# Patient Record
Sex: Male | Born: 2000 | Race: Black or African American | Hispanic: No | Marital: Single | Smoking: Never smoker
Health system: Southern US, Community
[De-identification: ages and names within clinical notes are randomized; demographics above are authoritative.]

---
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
## 2018-11-15 ENCOUNTER — Emergency Department
Admission: EM | Admit: 2018-11-15 | Discharge: 2018-11-15 | Disposition: A | Discharge status: Home / Self Care | Payer: BC Managed Care – PPO | Attending: Emergency Medicine | Admitting: Emergency Medicine

## 2018-11-15 ENCOUNTER — Other Ambulatory Visit: Payer: Self-pay

## 2018-11-15 DIAGNOSIS — J029 Acute pharyngitis, unspecified: Secondary | ICD-10-CM | POA: Diagnosis not present

## 2018-11-15 MED ORDER — AMOXICILLIN 875 MG PO TABS: 875.0000 mg | ORAL_TABLET | Freq: Two times a day (BID) | ORAL | 0 refills | Status: DC

## 2018-11-15 MED ORDER — MAGIC MOUTHWASH W/LIDOCAINE: 5.0000 mL | Freq: Four times a day (QID) | ORAL | 0 refills | Status: DC

## 2018-11-15 MED ORDER — AMOXICILLIN 500 MG PO CAPS
1000.0000 mg | ORAL_CAPSULE | Freq: Once | ORAL | Status: AC
  Administered 2018-11-15: 1000 mg via ORAL
  Filled 2018-11-15: qty 2

## 2018-11-15 MED ORDER — ACETAMINOPHEN 500 MG PO TABS
ORAL_TABLET | ORAL | Status: AC
  Administered 2018-11-15: 1000 mg via ORAL
  Filled 2018-11-15: qty 2

## 2018-11-15 MED ORDER — ACETAMINOPHEN 500 MG PO TABS
1000.0000 mg | ORAL_TABLET | Freq: Once | ORAL | Status: AC
  Administered 2018-11-15: 23:00:00 1000 mg via ORAL

## 2018-11-15 NOTE — ED Triage Notes (Signed)
Pt with sore throat for 3 days. Pt with red and swollen tonsils noted. Pt denies known fever, shob, cough, congestion.

## 2018-11-15 NOTE — ED Provider Notes (Signed)
[facility] Emergency Department Provider Note  ____________________________________________  Time seen: Approximately 10:14 PM  I have reviewed the triage vital signs and the nursing notes.   HISTORY  Chief Complaint Sore Throat    HPI William Payne is a 18 y.o. male who presents the emergency department complaining of sore throat.  Patient has had a sore throat for 5 to 7 days.  Patient states that it started on the right and now encompasses both sides.  Patient denies any voice changes, fevers or chills, difficulty breathing or swallowing.  No nasal congestion, coughing, sneezing.  Patient does have a history of peritonsillar abscess but states that the pain is not as severe, there is no voice changes.  No medications prior to arrival.  No other complaints at this time.         No past medical history on file.  Patient Active Problem List   Diagnosis Date Noted  . Right knee injury 02/01/2015  . Osgood-Schlatter's disease of left knee 06/26/2012    No past surgical history on file.  Prior to Admission medications   Medication Sig Start Date End Date Taking? Authorizing Provider  amoxicillin (AMOXIL) 875 MG tablet Take 1 tablet (875 mg total) by mouth 2 (two) times daily. 11/15/18   Guilianna Mckoy, Charline Bills, PA-C  amoxicillin-clavulanate (AUGMENTIN) 875-125 MG tablet Take 1 tablet by mouth 2 (two) times daily. 10/20/15   Hagler, Jami L, PA-C  dexamethasone (DECADRON) 4 MG tablet Take 6 tablets on Day 1 with food, then decrease by 1 tablet daily until finished (6,5,4,3,2,1) 10/20/15   Hagler, Jami L, PA-C  magic mouthwash w/lidocaine SOLN Take 5 mLs by mouth 4 (four) times daily. 11/15/18   Burak Zerbe, Charline Bills, PA-C    Allergies Patient has no known allergies.  No family history on file.  Social History Social History   Tobacco Use  . Smoking status: Never Smoker  . Smokeless tobacco: Never Used  Substance Use Topics  . Alcohol use: Not on  file  . Drug use: Not on file     Review of Systems  Constitutional: No fever/chills Eyes: No visual changes. No discharge ENT: Positive for sore throat Cardiovascular: no chest pain. Respiratory: no cough. No SOB. Gastrointestinal: No abdominal pain.  No nausea, no vomiting.  No diarrhea.  No constipation. Musculoskeletal: Negative for musculoskeletal pain. Skin: Negative for rash, abrasions, lacerations, ecchymosis. Neurological: Negative for headaches, focal weakness or numbness. 10-point ROS otherwise negative.  ____________________________________________   PHYSICAL EXAM:  VITAL SIGNS: ED Triage Vitals  Enc Vitals Group     BP 11/15/18 2201 (!) 128/92     Pulse Rate 11/15/18 2201 75     Resp 11/15/18 2201 16     Temp 11/15/18 2201 98 F (36.7 C)     Temp Source 11/15/18 2201 Oral     SpO2 11/15/18 2201 100 %     Weight 11/15/18 2202 168 lb (76.2 kg)     Height 11/15/18 2202 6\' 1"  (1.854 m)     Head Circumference --      Peak Flow --      Pain Score 11/15/18 2202 3     Pain Loc --      Pain Edu? --      Excl. in [place]? --      Constitutional: Alert and oriented. Well appearing and in no acute distress. Eyes: Conjunctivae are normal. PERRL. EOMI. Head: Atraumatic. ENT:      Ears:  Nose: No congestion/rhinnorhea.      Mouth/Throat: Mucous membranes are moist.  Visualization of the oropharynx reveals enlarged tonsils bilaterally, both tonsils are erythematous with exudates.  Uvula is midline.  Sub-lingular region is soft and nontender to palpation.  No erythema or edema of the anterior neck. Neck: No stridor.  Neck is supple with full range of motion.  No anterior edema or erythema. Hematological/Lymphatic/Immunilogical: Scattered, mildly tender bilateral anterior cervical cervical lymphadenopathy. Cardiovascular: Normal rate, regular rhythm. Normal S1 and S2.  Good peripheral circulation. Respiratory: Normal respiratory effort without tachypnea or retractions.  Lungs CTAB. Good air entry to the bases with no decreased or absent breath sounds. Musculoskeletal: Full range of motion to all extremities. No gross deformities appreciated. Neurologic:  Normal speech and language. No gross focal neurologic deficits are appreciated.  Skin:  Skin is warm, dry and intact. No rash noted. Psychiatric: Mood and affect are normal. Speech and behavior are normal. Patient exhibits appropriate insight and judgement.   ____________________________________________   LABS (all labs ordered are listed, but only abnormal results are displayed)  Labs Reviewed - No data to display ____________________________________________  EKG   ____________________________________________  RADIOLOGY   No results found.  ____________________________________________    PROCEDURES  Procedure(s) performed:    Procedures    Medications  amoxicillin (AMOXIL) capsule 1,000 mg (has no administration in time range)     ____________________________________________   INITIAL IMPRESSION / ASSESSMENT AND PLAN / ED COURSE  Pertinent labs & imaging results that were available during my care of the patient were reviewed by me and considered in my medical decision making (see chart for details).  Review of the [place] CSRS was performed in accordance of the [facility] prior to dispensing any controlled drugs.           Patient's diagnosis is consistent with pharyngitis.  Patient presented to the emergency department complaining of sore throat.  Patient has had 5 to 7 days history of worsening sore throat.  Patient does have a history of peritonsillar abscess but has no voice changes, no unilateral edema of tonsils, and no uvular deviation.  Based off her presentation I suspect strep as the etiology.  At this time I discussed testing versus treating empirically both patient and the father prefer to treat empirically.  Patient will be prescribed amoxicillin, Magic mouthwash.   Follow-up primary care. Patient is given ED precautions to return to the ED for any worsening or new symptoms.     ____________________________________________  FINAL CLINICAL IMPRESSION(S) / ED DIAGNOSES  Final diagnoses:  Pharyngitis, unspecified etiology      NEW MEDICATIONS STARTED DURING THIS VISIT:  ED Discharge Orders         Ordered    amoxicillin (AMOXIL) 875 MG tablet  2 times daily     11/15/18 2239    magic mouthwash w/lidocaine SOLN  4 times daily    Note to Pharmacy: Dispense in a 1/1/1 ratio. Use lidocaine, diphenhydramine, prednisolone   11/15/18 2239              This chart was dictated using voice recognition software/Dragon. Despite best efforts to proofread, errors can occur which can change the meaning. Any change was purely unintentional.    Racheal Patches, PA-C 11/15/18 2239    Jene Every, MD 11/15/18 2245

## 2019-05-16 ENCOUNTER — Other Ambulatory Visit: Payer: Self-pay

## 2019-05-16 ENCOUNTER — Ambulatory Visit: Payer: BC Managed Care – PPO | Attending: Internal Medicine

## 2019-05-16 DIAGNOSIS — Z23 Encounter for immunization: Secondary | ICD-10-CM

## 2019-05-16 NOTE — Progress Notes (Signed)
   Covid-19 Vaccination Clinic  Name:  William Payne    MRN: [id number] DOB: October 17, 2000  05/16/2019  William Payne was observed post Covid-19 immunization for 15 minutes without incident. He was provided with Vaccine Information Sheet and instruction to access the V-Safe system.   William Payne was instructed to call 911 with any severe reactions post vaccine: Marland Kitchen Difficulty breathing  . Swelling of face and throat  . A fast heartbeat  . A bad rash all over body  . Dizziness and weakness   Immunizations Administered    Name Date Dose VIS Date Route   Pfizer COVID-19 Vaccine 05/16/2019  4:42 PM 0.3 mL 01/15/2019 Intramuscular   Manufacturer: [employer], [employer]   Lot: [id number]   NDC: [address]-9

## 2023-09-27 ENCOUNTER — Emergency Department
Admission: EM | Admit: 2023-09-27 | Discharge: 2023-09-27 | Disposition: A | Discharge status: Home / Self Care | Attending: Emergency Medicine | Admitting: Emergency Medicine

## 2023-09-27 ENCOUNTER — Encounter: Payer: Self-pay | Admitting: Emergency Medicine

## 2023-09-27 ENCOUNTER — Other Ambulatory Visit: Payer: Self-pay

## 2023-09-27 DIAGNOSIS — Z23 Encounter for immunization: Secondary | ICD-10-CM | POA: Diagnosis not present

## 2023-09-27 DIAGNOSIS — S81812A Laceration without foreign body, left lower leg, initial encounter: Secondary | ICD-10-CM | POA: Diagnosis present

## 2023-09-27 DIAGNOSIS — W25XXXA Contact with sharp glass, initial encounter: Secondary | ICD-10-CM | POA: Insufficient documentation

## 2023-09-27 MED ORDER — LIDOCAINE HCL (PF) 1 % IJ SOLN
5.0000 mL | Freq: Once | INTRAMUSCULAR | Status: DC
  Filled 2023-09-27: qty 5

## 2023-09-27 MED ORDER — TETANUS-DIPHTH-ACELL PERTUSSIS 5-2.5-18.5 LF-MCG/0.5 IM SUSY
0.5000 mL | PREFILLED_SYRINGE | Freq: Once | INTRAMUSCULAR | Status: AC
  Administered 2023-09-27: 0.5 mL via INTRAMUSCULAR
  Filled 2023-09-27: qty 0.5

## 2023-09-27 NOTE — ED Provider Notes (Signed)
 [facility] Emergency Department Provider Note     Event Date/Time   First MD Initiated Contact with Patient 09/27/23 2107     (approximate)   History   Laceration   HPI  William Payne is a 23 y.o. male with a noncontributory medical history, presents to the ED for axetil laceration to the left leg.  Patient was carrying out his garbage, when he apparently got cut by an unknown piece of broken glass in the trash bag.  He presents to the ED for evaluation of the lateral left leg laceration.  No active bleeding at this time.  No other injury reported.  Patient with unclear tetanus status.   Physical Exam   Triage Vital Signs: ED Triage Vitals  Encounter Vitals Group     BP 09/27/23 1927 129/71     Girls Systolic BP Percentile --      Girls Diastolic BP Percentile --      Boys Systolic BP Percentile --      Boys Diastolic BP Percentile --      Pulse Rate 09/27/23 1927 63     Resp 09/27/23 1927 20     Temp 09/27/23 1927 97.8 F (36.6 C)     Temp Source 09/27/23 1927 Oral     SpO2 09/27/23 1927 100 %     Weight 09/27/23 1925 205 lb (93 kg)     Height 09/27/23 1925 6' 1 (1.854 m)     Head Circumference --      Peak Flow --      Pain Score 09/27/23 1925 0     Pain Loc --      Pain Education --      Exclude from Growth Chart --     Most recent vital signs: Vitals:   09/27/23 1927  BP: 129/71  Pulse: 63  Resp: 20  Temp: 97.8 F (36.6 C)  SpO2: 100%    General Awake, no distress. NAD HEENT NCAT. PERRL. EOMI. No rhinorrhea. Mucous membranes are moist.  CV:  Good peripheral perfusion. RRR RESP:  Normal effort. CTA SKIN:  LLE with a 10 cm laceration to the lateral aspect of the leg.  The irregular similar leg with subcutaneous skin fat exposed is otherwise without evidence of debris or contamination.  No active bleed noted at this time.   ED Results / Procedures / Treatments   Labs (all labs ordered are listed, but only abnormal  results are displayed) Labs Reviewed - No data to display   EKG   RADIOLOGY   No results found.   PROCEDURES:  Critical Care performed: No  .Laceration Repair  Date/Time: 09/27/2023 10:47 PM  Performed by: Loyd Candida LULLA Aldona, PA-C Authorized by: Loyd Candida LULLA Aldona, PA-C   Consent:    Consent obtained:  Verbal   Consent given by:  Patient   Risks, benefits, and alternatives were discussed: yes     Risks discussed:  Pain and poor wound healing Universal protocol:    Site/side marked: yes     Patient identity confirmed:  Verbally with patient Anesthesia:    Anesthesia method:  Local infiltration   Local anesthetic:  Lidocaine  1% w/o epi Laceration details:    Location:  Leg   Leg location:  L lower leg   Length (cm):  10   Depth (mm):  5 Pre-procedure details:    Preparation:  Patient was prepped and draped in usual sterile fashion Exploration:    Limited  defect created (wound extended): no     Hemostasis achieved with:  Direct pressure   Contaminated: no   Treatment:    Area cleansed with:  Saline and povidone-iodine   Amount of cleaning:  Standard   Irrigation solution:  Sterile saline   Irrigation volume:  10   Irrigation method:  Syringe   Debridement:  None   Undermining:  None   Scar revision: no   Skin repair:    Repair method:  Sutures   Suture size:  3-0   Suture material:  Nylon   Suture technique:  Simple interrupted   Number of sutures:  10 Approximation:    Approximation:  Close Repair type:    Repair type:  Simple Post-procedure details:    Dressing:  Non-adherent dressing   Procedure completion:  Tolerated well, no immediate complications    MEDICATIONS ORDERED IN ED: Medications  lidocaine  (PF) (XYLOCAINE ) 1 % injection 5 mL (5 mLs Infiltration Not Given 09/27/23 2306)     IMPRESSION / MDM / ASSESSMENT AND PLAN / ED COURSE  I reviewed the triage vital signs and the nursing notes.                               Differential diagnosis includes, but is not limited to, laceration, abrasion, contusion  Patient's presentation is most consistent with acute, uncomplicated illness.  Patient's diagnosis is consistent with accidental laceration to the left leg.  Patient's wound was repaired using sutures with good wound edge approximation.  Patient will be discharged home with wound care instructions and supplies. Patient is to follow up with his PCP in 10 to 14 days for suture removal as discussed, as needed or otherwise directed. Patient is given ED precautions to return to the ED for any worsening or new symptoms.   FINAL CLINICAL IMPRESSION(S) / ED DIAGNOSES   Final diagnoses:  Leg laceration, left, initial encounter     Rx / DC Orders   ED Discharge Orders     None        Note:  This document was prepared using Dragon voice recognition software and may include unintentional dictation errors.    Loyd Candida LULLA Aldona, PA-C 09/27/23 2309    Jacolyn Pae, MD 09/27/23 [phone]

## 2023-09-27 NOTE — ED Triage Notes (Signed)
 Patient reports piece of glass cut him through trash bag when taking out trash. Denies trauma during accident. Ambulatory to triage. Bleeding controlled on arrival.

## 2023-09-27 NOTE — Discharge Instructions (Signed)
 Keep the wound clean, dry, and covered.  See your primary provider or local urgent care for suture removal in 10 to 14 days.

## 2023-10-13 ENCOUNTER — Ambulatory Visit
Admission: EM | Admit: 2023-10-13 | Discharge: 2023-10-13 | Disposition: A | Discharge status: Home / Self Care | Attending: Emergency Medicine | Admitting: Emergency Medicine

## 2023-10-13 DIAGNOSIS — Z4802 Encounter for removal of sutures: Secondary | ICD-10-CM

## 2023-10-13 DIAGNOSIS — S81812A Laceration without foreign body, left lower leg, initial encounter: Secondary | ICD-10-CM | POA: Diagnosis not present

## 2023-10-13 NOTE — ED Triage Notes (Signed)
 Patient is here for suture removal on left leg. Patient has no concerns.

## 2023-10-13 NOTE — ED Provider Notes (Signed)
 HPI  SUBJECTIVE:  William Payne is a 23 y.o. male who presents with ***    History reviewed. No pertinent past medical history.  History reviewed. No pertinent surgical history.  History reviewed. No pertinent family history.  Social History   Tobacco Use   Smoking status: Never   Smokeless tobacco: Never    No current facility-administered medications for this encounter. No current outpatient medications on file.  No Known Allergies   ROS  As noted in HPI.   Physical Exam  BP 124/85 (BP Location: Left Arm)   Pulse 61   Temp 98.2 F (36.8 C) (Oral)   Resp 18   Wt 93 kg   SpO2 97%   BMI 27.05 kg/m  *** Constitutional: Well developed, well nourished, no acute distress Eyes:  EOMI, conjunctiva normal bilaterally HENT: Normocephalic, atraumatic,mucus membranes moist Respiratory: Normal inspiratory effort Cardiovascular: Normal rate GI: nondistended skin: 8 cm well-healing laceration with sutures intact.  No expressible purulent drainage.  No surrounding erythema, induration.  Musculoskeletal: no deformities Neurologic: Alert & oriented x 3, no focal neuro deficits Psychiatric: Speech and behavior appropriate   ED Course   Medications - No data to display  No orders of the defined types were placed in this encounter.   No results found for this or any previous visit (from the past 24 hours). No results found.  ED Clinical Impression  1. Laceration of left lower extremity, initial encounter   2. Visit for suture removal      ED Assessment/Plan   {The patient has not been seen in Urgent Care in the last 3 years. :1}  Patient with well-healing laceration left lower leg, still in the process of healing.  No evidence of infection..  Will have staff remove sutures.  Continue wound care.  May return to the gym.   Discussed labs, imaging, MDM, treatment plan, and plan for follow-up with {Blank single:19197::family,parent,patient}. Discussed  sn/sx that should prompt return to the ED. {Blank single:19197::family,parent,patient} agrees with plan.   No orders of the defined types were placed in this encounter.     *This clinic note was created using Dragon dictation software. Therefore, there may be occasional mistakes despite careful proofreading.  ?
